# Patient Record
Sex: Female | Born: 1990 | Race: Black or African American | Hispanic: No | Marital: Single | Smoking: Former smoker
Health system: Southern US, Community
[De-identification: ages and names within clinical notes are randomized; demographics above are authoritative.]

## PROBLEM LIST (undated history)

## (undated) DIAGNOSIS — E282 Polycystic ovarian syndrome: Secondary | ICD-10-CM

## (undated) DIAGNOSIS — U071 COVID-19: Secondary | ICD-10-CM

---
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
## 2010-06-01 ENCOUNTER — Emergency Department (HOSPITAL_COMMUNITY)
Admission: EM | Admit: 2010-06-01 | Discharge: 2010-06-01 | Disposition: A | Discharge status: Home / Self Care | Payer: Self-pay | Attending: Emergency Medicine | Admitting: Emergency Medicine

## 2010-06-01 ENCOUNTER — Emergency Department (HOSPITAL_COMMUNITY): Payer: Self-pay

## 2010-06-01 DIAGNOSIS — N12 Tubulo-interstitial nephritis, not specified as acute or chronic: Secondary | ICD-10-CM | POA: Insufficient documentation

## 2010-06-01 DIAGNOSIS — R109 Unspecified abdominal pain: Secondary | ICD-10-CM | POA: Insufficient documentation

## 2010-06-01 DIAGNOSIS — R110 Nausea: Secondary | ICD-10-CM | POA: Insufficient documentation

## 2010-06-01 LAB — POCT I-STAT, CHEM 8
Calcium, Ion: 1.2 mmol/L (ref 1.12–1.32)
Glucose, Bld: 105 mg/dL — ABNORMAL HIGH (ref 70–99)
HCT: 34 % — ABNORMAL LOW (ref 36.0–46.0)
Hemoglobin: 11.6 g/dL — ABNORMAL LOW (ref 12.0–15.0)
TCO2: 24 mmol/L (ref 0–100)

## 2010-06-01 LAB — URINALYSIS, ROUTINE W REFLEX MICROSCOPIC
Glucose, UA: NEGATIVE mg/dL
Nitrite: NEGATIVE
Specific Gravity, Urine: 1.031 — ABNORMAL HIGH (ref 1.005–1.030)
pH: 5.5 (ref 5.0–8.0)

## 2010-06-01 LAB — URINE MICROSCOPIC-ADD ON

## 2010-06-01 LAB — POCT PREGNANCY, URINE: Preg Test, Ur: NEGATIVE

## 2010-07-24 NOTE — Group Therapy Note (Signed)
NAME:  Sabrina Kerr, Sabrina Kerr NO.:  [id number]   MEDICAL RECORD NO.:  [id number]          PATIENT TYPE:  WOC   LOCATION:  [facility]                   FACILITY:  [facility]   PHYSICIAN:  Johnella Moloney, MD        DATE OF BIRTH:  09/10/1990   DATE OF SERVICE:  07/08/2008                                  CLINIC NOTE   CHIEF COMPLAINT:  Amenorrhea for 5 months.   HISTORY OF PRESENT ILLNESS:  The patient is a 20 year old gravida 0 who  is here today for evaluation for amenorrhea that lasted for 5 months.  The patient had menarche at age 68.  She says that she has never had  regular periods as her periods come about every 1-2 months on average;  however, she had amenorrhea that lasted for 5 months that finally  resulted in a period in June 01, 2008.  The patient is sexually active,  but by report that she was not pregnant.  The patient is very concerned  about her amenorrhea.  She used to be on oral contraceptive pills in the  past, but is not taking any oral contraception right now.  She does use  condoms for contraception.  Apart from her irregular menses, the patient  is concerned about having a lot of acne and also having keloids on her  face and ears.  She also reports abnormal vaginal discharge and odor  that has been going on for a few months and wants evaluation for this.   PAST OBSTETRIC/GYNECOLOGICAL HISTORY:  G0.   MENSTRUAL HISTORY:  As above.  Last menstrual period was on June 01, 2008.  The patient used to be on Yaz for birth control and tolerated  this well.  She is not taking this currently.  She uses condoms for  contraception and FTI prevention.  The patient did have a last Pap smear  according to her in March 2009 which was negative.  She was told she  does not need another Pap smear until she is 60.  The patient has not  received the Gardasil series and was strongly advised to do so as soon  as possible.   PAST MEDICAL HISTORY:  Asthma which is mild.  The  patient is on no  maintenance therapy.   PAST SURGICAL HISTORY:  None.   MEDICATIONS:  None.   ALLERGIES:  No known drug allergies.   SOCIAL HISTORY:  The patient lives with her family.  She is currently in  school.  She denies smoking, drinking, or any illicit drug use.  She  denies any history of past or current sexual physical or emotional  abuse.   FAMILIAL HISTORY:  Remarkable for great aunt that had brain cancer.  There is also an extensive family history of diabetes, heart disease,  heart attack, and high blood pressure.   REVIEW OF SYSTEMS:  The 14-point comprehensive review of system was  reviewed with the patient and was only remarkable for weight gain and  vaginal odor.   PHYSICAL EXAMINATION:  VITAL SIGNS:  Temperature is 97.9, pulse 71,  blood pressure 112/74, weight 231.6  pounds, height 5 feet 6 inches.  GENERAL:  No apparent distress.  SKIN:  The patient has multiple acne papules over her face, chest, back,  and around her mons pubis.  The patient also has some acanthosis  nigricans that is visible around her neck area and some hair growth  noted on her upper lip.  ABDOMEN:  Soft, obese, nontender.  EXTREMITIES:  No cyanosis, clubbing, or edema.  EAR:  Externally, the patient does have large keloid present on  bilateral auricles.  PELVIC:  The patient does have some pimples that are noted on her mons  pubis.  Question of possible hidradenitis suppurativa, but her lesions  did not look as extensive as is usually seen in hidradenitis  suppurativa.  Otherwise, she has normal external female genitalia.  Pink  well rugated vagina.  No lesions seen.  There was copious amount of  thin, white discharge, and the sample was taken for wet prep.  There was  no odor perceived.  Her cervix is nulliparous and normal.  No abnormal  cervical discharge.  As per patient's request, a gonorrhea and Chlamydia  test was also done.  On bimanual exam, the patient has a small uterus   and no tenderness.  Adnexa were unable to be palpated secondary to  patient's habitus.   ASSESSMENT/PLAN:  The patient is a 20 year old gravida 0 who is here for  evaluation of a few concerns.  The first one being oligomenorrhea.  The  patient does meet criteria for polycystic ovarian syndrome given her  periods of anovulation and also signs of hyperandrogenism by given by  her acne and also abnormal hair growth.  This was discussed with the  patient and she was also given a ACOG patient information pamphlet to  review at home.  She was told that given her anovulatory periods and  elevated androgens that the first line of therapy is usually combined  oral contraceptive pills.  The patient is amendable to this and wants to  go back on Yaz because this was tolerated well in the past.  She was  given samples for Yaz and also a prescription for a year's worth of  Yasmin.  She was also told that this combined oral contraceptive pills  will help with her acne.  As for her vaginal odor, the patient was  counseled regarding proper vulvar hygiene and will follow up on the wet  prep results for a further evaluation and management of this.  As for  her keloids, the patient was told that she will be referred to  Dermatology for further evaluation and management.  Of note, as part of  workup for her PCOS, she had a baseline labs drawn today including TSH,  prolactin, hemoglobin A1c, free testosterone, glucose level, and HCG to  work up her amenorrhea.  She will also be booked for pelvic ultrasound  to evaluate her ovaries.  The patient was told about the risks of PCOS  and increased risk of hypertension and diabetes and weight loss efforts  where heavily recommended as this would help in resuming regular  ovulatory cycles and also reduce her risk for all these other health  conditions.  The patient was given ACOG pamphlets for her PCOS and also  for her vaginitis.  She was also strongly encouraged to  [facility] with  [facility] to initiate and obtain a Gardasil series.  She was  given written information about the Gardasil also.  The patient is to  follow up in 2-3 weeks for discussion of her results and to address any  further concerns or questions the patient might have.           ______________________________  Johnella Moloney, MD     UD/MEDQ  D:  07/08/2008  T:  07/09/2008  Job:  [id number]

## 2011-03-21 ENCOUNTER — Encounter (HOSPITAL_COMMUNITY): Payer: Self-pay | Admitting: *Deleted

## 2011-03-21 ENCOUNTER — Emergency Department (HOSPITAL_COMMUNITY)
Admission: EM | Admit: 2011-03-21 | Discharge: 2011-03-22 | Discharge status: Against Medical Advice | Payer: Medicaid Other | Attending: Emergency Medicine | Admitting: Emergency Medicine

## 2011-03-21 DIAGNOSIS — R6883 Chills (without fever): Secondary | ICD-10-CM | POA: Insufficient documentation

## 2011-03-21 NOTE — ED Notes (Signed)
Pt called x 2 with no answer  

## 2011-03-21 NOTE — ED Notes (Signed)
Name called no answer 

## 2011-03-21 NOTE — ED Notes (Signed)
Cold for 3 days with a headache and an elevated temp with body aches

## 2013-02-06 ENCOUNTER — Emergency Department (HOSPITAL_COMMUNITY): Payer: Self-pay

## 2013-02-06 ENCOUNTER — Emergency Department (HOSPITAL_COMMUNITY)
Admission: EM | Admit: 2013-02-06 | Discharge: 2013-02-06 | Disposition: A | Discharge status: Home / Self Care | Payer: Self-pay | Attending: Emergency Medicine | Admitting: Emergency Medicine

## 2013-02-06 DIAGNOSIS — N390 Urinary tract infection, site not specified: Secondary | ICD-10-CM | POA: Insufficient documentation

## 2013-02-06 DIAGNOSIS — F17200 Nicotine dependence, unspecified, uncomplicated: Secondary | ICD-10-CM | POA: Insufficient documentation

## 2013-02-06 DIAGNOSIS — Z3202 Encounter for pregnancy test, result negative: Secondary | ICD-10-CM | POA: Insufficient documentation

## 2013-02-06 DIAGNOSIS — R42 Dizziness and giddiness: Secondary | ICD-10-CM | POA: Insufficient documentation

## 2013-02-06 DIAGNOSIS — A5901 Trichomonal vulvovaginitis: Secondary | ICD-10-CM | POA: Insufficient documentation

## 2013-02-06 DIAGNOSIS — J45909 Unspecified asthma, uncomplicated: Secondary | ICD-10-CM | POA: Insufficient documentation

## 2013-02-06 DIAGNOSIS — R110 Nausea: Secondary | ICD-10-CM | POA: Insufficient documentation

## 2013-02-06 DIAGNOSIS — A599 Trichomoniasis, unspecified: Secondary | ICD-10-CM

## 2013-02-06 DIAGNOSIS — R109 Unspecified abdominal pain: Secondary | ICD-10-CM | POA: Insufficient documentation

## 2013-02-06 LAB — CBC WITH DIFFERENTIAL/PLATELET
Basophils Absolute: 0 10*3/uL (ref 0.0–0.1)
Basophils Relative: 1 % (ref 0–1)
MCHC: 33.8 g/dL (ref 30.0–36.0)
Monocytes Absolute: 0.4 10*3/uL (ref 0.1–1.0)
Neutro Abs: 2.3 10*3/uL (ref 1.7–7.7)
Neutrophils Relative %: 42 % — ABNORMAL LOW (ref 43–77)
Platelets: 364 10*3/uL (ref 150–400)
RDW: 12.2 % (ref 11.5–15.5)
WBC: 5.5 10*3/uL (ref 4.0–10.5)

## 2013-02-06 LAB — URINALYSIS, ROUTINE W REFLEX MICROSCOPIC
Glucose, UA: NEGATIVE mg/dL
Protein, ur: NEGATIVE mg/dL

## 2013-02-06 LAB — COMPREHENSIVE METABOLIC PANEL
ALT: 20 U/L (ref 0–35)
AST: 17 U/L (ref 0–37)
Albumin: 4 g/dL (ref 3.5–5.2)
Chloride: 100 mEq/L (ref 96–112)
Creatinine, Ser: 0.77 mg/dL (ref 0.50–1.10)
Potassium: 4 mEq/L (ref 3.5–5.1)
Sodium: 135 mEq/L (ref 135–145)
Total Bilirubin: 0.2 mg/dL — ABNORMAL LOW (ref 0.3–1.2)

## 2013-02-06 LAB — WET PREP, GENITAL: Yeast Wet Prep HPF POC: NONE SEEN

## 2013-02-06 LAB — URINE MICROSCOPIC-ADD ON

## 2013-02-06 LAB — POCT PREGNANCY, URINE: Preg Test, Ur: NEGATIVE

## 2013-02-06 MED ORDER — KETOROLAC TROMETHAMINE 30 MG/ML IJ SOLN
30.0000 mg | Freq: Once | INTRAMUSCULAR | Status: AC
  Administered 2013-02-06: 30 mg via INTRAVENOUS
  Filled 2013-02-06: qty 1

## 2013-02-06 MED ORDER — SODIUM CHLORIDE 0.9 % IV BOLUS (SEPSIS)
1000.0000 mL | Freq: Once | INTRAVENOUS | Status: AC
  Administered 2013-02-06: 1000 mL via INTRAVENOUS

## 2013-02-06 MED ORDER — DEXTROSE 5 % IV SOLN
1.0000 g | Freq: Once | INTRAVENOUS | Status: AC
  Administered 2013-02-06: 1 g via INTRAVENOUS
  Filled 2013-02-06: qty 10

## 2013-02-06 MED ORDER — METRONIDAZOLE 500 MG PO TABS: 500.0000 mg | ORAL_TABLET | Freq: Two times a day (BID) | ORAL | Status: DC

## 2013-02-06 MED ORDER — CEPHALEXIN 500 MG PO CAPS: 500.0000 mg | ORAL_CAPSULE | Freq: Four times a day (QID) | ORAL | Status: DC

## 2013-02-06 NOTE — ED Notes (Signed)
The pt has had vaginal bleeding for approx one week.  No  n v or diarrhea.

## 2013-02-06 NOTE — ED Notes (Signed)
Pt came in today b/c she is worried about having a period that is longer/heavier than normal.  Normally her periods last 3 days.  But her period started on 11/21 and ended on 11/28 and pt states she had large clots.  Pt c/o L lower back pain that radiates to her L lower abdomen.  Pt denies any vaginal discharge/bleeding at this time.  But she is worried she has ovarian cancer b/c she feels nauseated.  Her nephew just died of sids and she feels sick about it.

## 2013-02-06 NOTE — ED Notes (Signed)
One unsuccessful  Attempt to start iv when the c-t tech arrived .  Pt to c-t

## 2013-02-06 NOTE — ED Notes (Signed)
Pt returned from ct

## 2013-02-06 NOTE — ED Provider Notes (Signed)
CSN: [id number]     Arrival date & time 02/06/13  1504 History   First MD Initiated Contact with Patient 02/06/13 1739     Chief Complaint  Patient presents with  . heavy menses   . Nausea   (Consider location/radiation/quality/duration/timing/severity/associated sxs/prior Treatment) Patient is a 22 y.o. female presenting with abdominal pain. The history is provided by the patient.  Abdominal Pain Pain location:  L flank and suprapubic Pain quality: aching and sharp   Pain radiates to:  L flank (lower back) Pain severity:  Moderate Onset quality:  Sudden Duration:  2 days Timing:  Intermittent Progression:  Worsening Chronicity:  New Context: not recent sexual activity, not retching and not sick contacts   Context comment:  Began at end of last period Relieved by:  Nothing Worsened by:  Nothing tried Associated symptoms: nausea and vaginal bleeding (last period twice as long and much heavier than normal.)   Associated symptoms: no chest pain, no cough, no fever, no shortness of breath and no vomiting     Past Medical History  Diagnosis Date  . Asthma    No past surgical history on file. No family history on file. History  Substance Use Topics  . Smoking status: Current Every Day Smoker  . Smokeless tobacco: Not on file  . Alcohol Use: No   OB History   Grav Para Term Preterm Abortions TAB SAB Ect Mult Living                 Review of Systems  Constitutional: Negative for fever.  Respiratory: Negative for cough and shortness of breath.   Cardiovascular: Negative for chest pain and leg swelling.  Gastrointestinal: Positive for nausea and abdominal pain. Negative for vomiting.  Genitourinary: Positive for vaginal bleeding (last period twice as long and much heavier than normal.).  Neurological: Positive for dizziness (with standing up).  All other systems reviewed and are negative.    Allergies  Review of patient's allergies indicates no known allergies.  Home  Medications   Current Outpatient Rx  Name  Route  Sig  Dispense  Refill  . diphenhydrAMINE (BENADRYL) 25 mg capsule   Oral   Take 25 mg by mouth daily as needed for allergies.         Marland Kitchen ibuprofen (ADVIL,MOTRIN) 200 MG tablet   Oral   Take 400 mg by mouth every 8 (eight) hours as needed for mild pain. For pain/fever           BP 105/78  Pulse 76  Temp(Src) 98.7 F (37.1 C) (Oral)  Resp 16  Ht 5\' 6"  (1.676 m)  Wt 246 lb (111.585 kg)  BMI 39.72 kg/m2  SpO2 100%  LMP 01/29/2013 Physical Exam  Nursing note and vitals reviewed. Constitutional: She is oriented to person, place, and time. She appears well-developed and well-nourished. No distress.  HENT:  Head: Normocephalic and atraumatic.  Eyes: EOM are normal. Pupils are equal, round, and reactive to light.  Neck: Normal range of motion. Neck supple.  Cardiovascular: Normal rate and regular rhythm.  Exam reveals no friction rub.   No murmur heard. Pulmonary/Chest: Effort normal and breath sounds normal. No respiratory distress. She has no wheezes. She has no rales.  Abdominal: Soft. She exhibits no distension. There is tenderness (L flank, suprapubic. No CVA tenderness). There is no rebound.  Genitourinary: There is no rash, tenderness or lesion on the right labia. There is no rash, tenderness or lesion on the left labia. Cervix exhibits  discharge (brown/red). Cervix exhibits no motion tenderness. Right adnexum displays no mass, no tenderness and no fullness. Left adnexum displays no mass, no tenderness and no fullness.  Musculoskeletal: Normal range of motion. She exhibits no edema.  Neurological: She is alert and oriented to person, place, and time. No cranial nerve deficit. She exhibits normal muscle tone. Coordination normal.  Skin: Skin is warm. No rash noted. She is not diaphoretic.    ED Course  Procedures (including critical care time) Labs Review Labs Reviewed  CBC WITH DIFFERENTIAL - Abnormal; Notable for the  following:    Neutrophils Relative % 42 (*)    Lymphocytes Relative 49 (*)    All other components within normal limits  COMPREHENSIVE METABOLIC PANEL - Abnormal; Notable for the following:    Total Bilirubin 0.2 (*)    All other components within normal limits  URINALYSIS, ROUTINE W REFLEX MICROSCOPIC - Abnormal; Notable for the following:    APPearance CLOUDY (*)    Hgb urine dipstick LARGE (*)    Leukocytes, UA MODERATE (*)    All other components within normal limits  URINE MICROSCOPIC-ADD ON - Abnormal; Notable for the following:    Squamous Epithelial / LPF FEW (*)    Bacteria, UA FEW (*)    All other components within normal limits  URINE CULTURE  GC/CHLAMYDIA PROBE AMP  WET PREP, GENITAL  POCT PREGNANCY, URINE   Imaging Review No results found.  EKG Interpretation   None       MDM   1. UTI (urinary tract infection)   2. Trichomonas    89F presents with L flank pain. Intermittent, sharp pains, started at the end of her period. Continued after period ended. No pains like this before. No vaginal discharge or bleeding at this time. Associated nausea and orthostatic dizziness. Last period was heavier and longer than normal with clots, which is new for her. Here AFVSS. Mild L flank pain on exam, no CVA tenderness. Suprapubic tenderness also.  Not pregnant, blood counts normal. With L flank pain and large blood on UA, will scan for kidney stone. Rocephin given for UTI. No kidney stone here. Trichomonas seen on wet prep. Keflex and flagyl given. Stable for discharge.     Dagmar Hait, MD 02/07/13 0005

## 2013-02-07 LAB — URINE CULTURE

## 2013-02-09 LAB — GC/CHLAMYDIA PROBE AMP: CT Probe RNA: NEGATIVE

## 2014-12-28 IMAGING — CT CT ABD-PELV W/O CM
2 of 4 series · 17 of 46 positions shown, 19 images · non-contrast
Comparison: 06/01/2010 CT

CLINICAL DATA: 22-year-old female with left abdominal and pelvic
pain and menorrhagia.

EXAM:
CT ABDOMEN AND PELVIS WITHOUT CONTRAST
TECHNIQUE: Multidetector CT imaging of the abdomen and pelvis was performed
following the standard protocol without intravenous contrast.

[Series 2: abd/ pelvis 5.0 i30f 1 · axial · 0.70mm/px · z∈[+194,+659]mm · 14 of 105 slices shown, 16 images]
[im 6/105  soft-tissue]
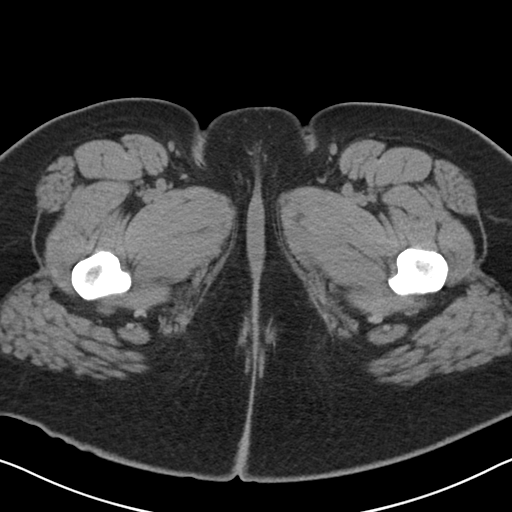
[im 6/105  bone]
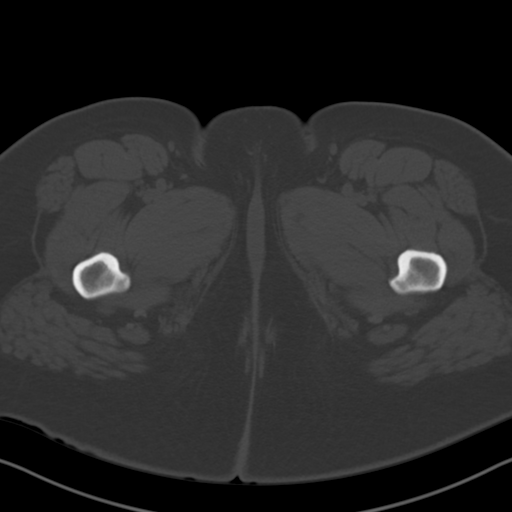
[im 11/105  soft-tissue]
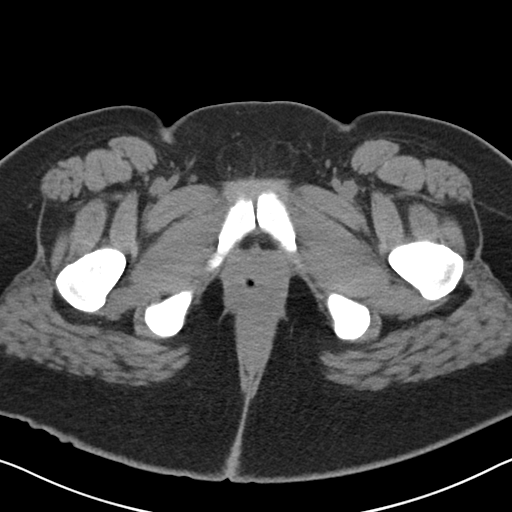
[im 22/105  soft-tissue]
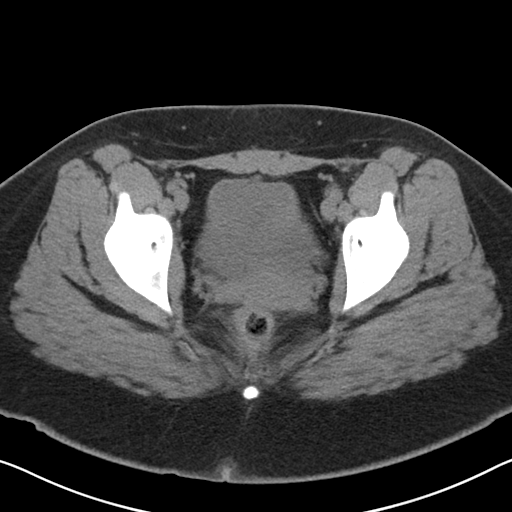
[im 28/105  soft-tissue]
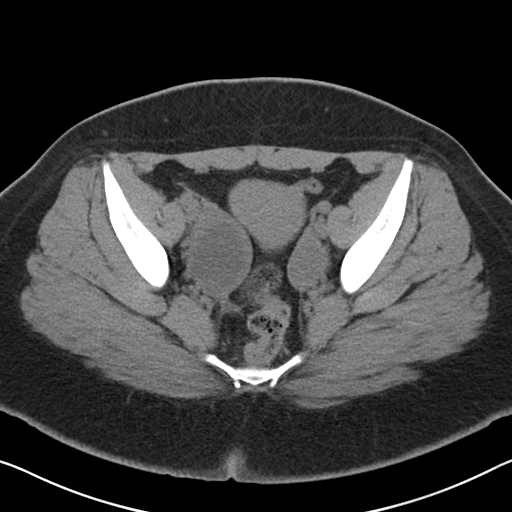
[im 33/105  soft-tissue]
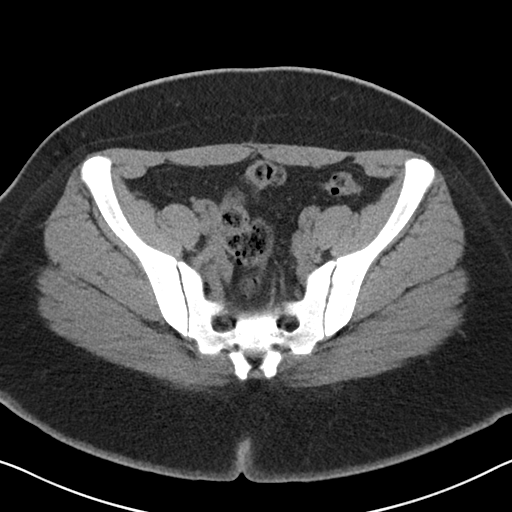
[im 44/105  soft-tissue]
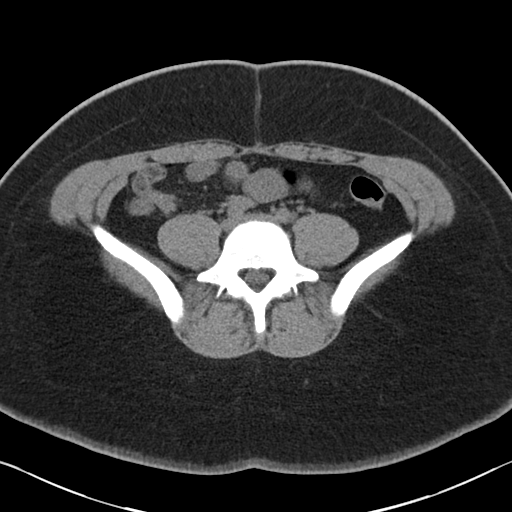
[im 50/105  soft-tissue]
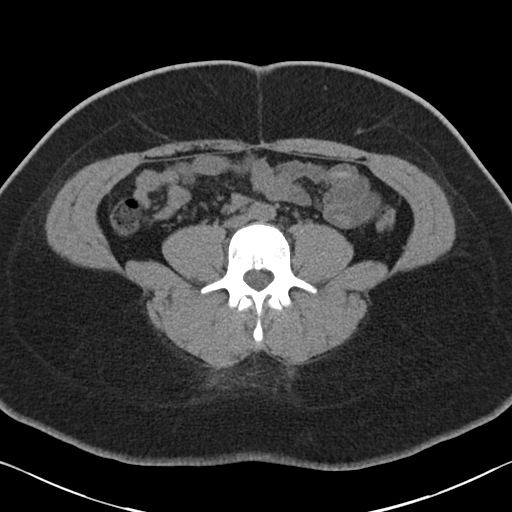
[im 55/105  soft-tissue]
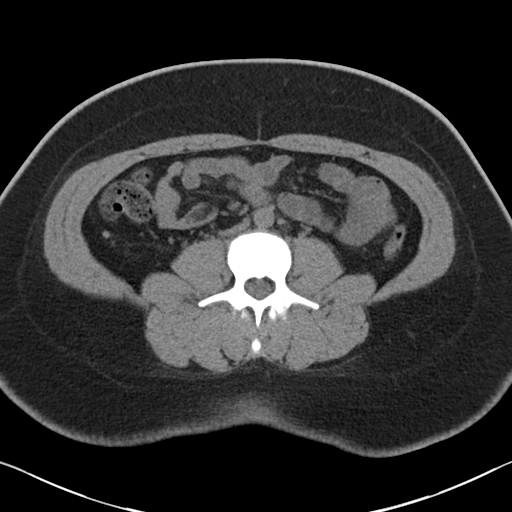
[im 61/105  soft-tissue]
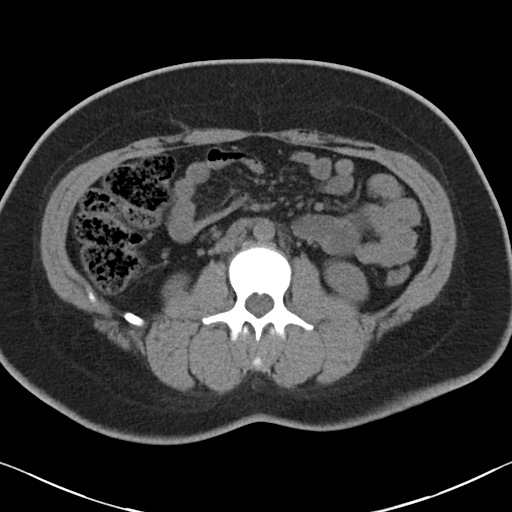
[im 61/105  bone]
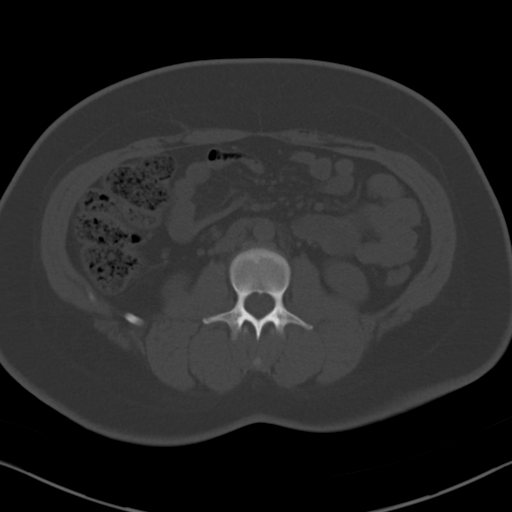
[im 72/105  soft-tissue]
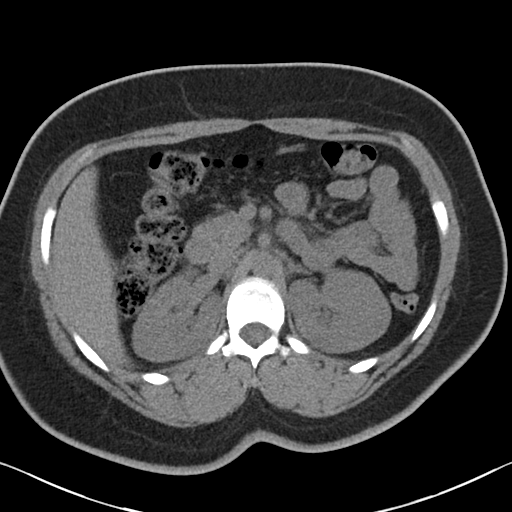
[im 77/105  soft-tissue]
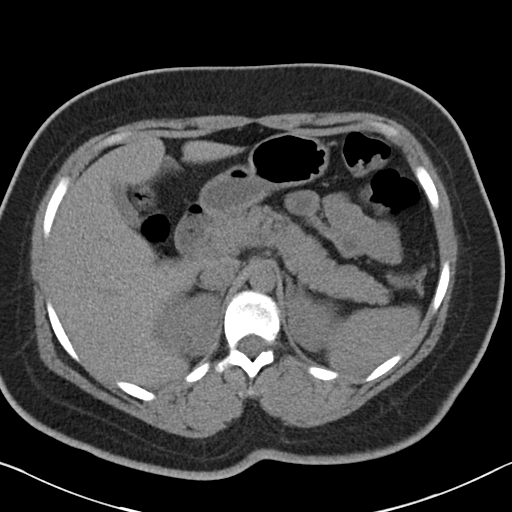
[im 83/105  soft-tissue]
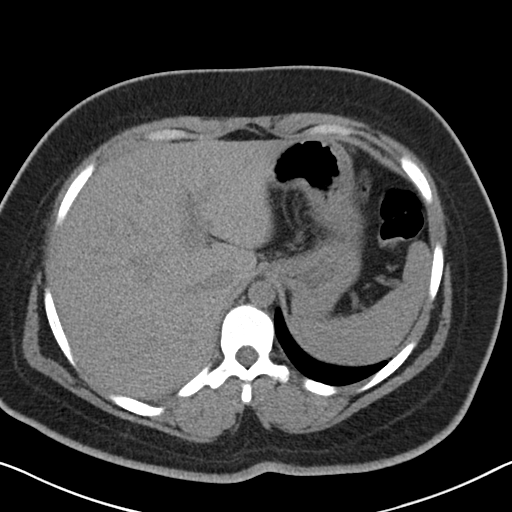
[im 94/105  soft-tissue]
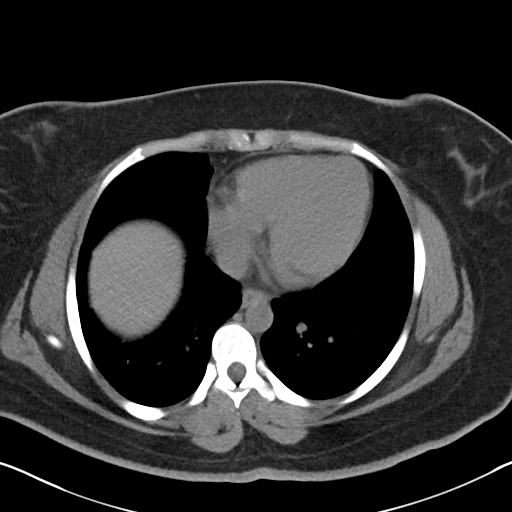
[im 99/105  soft-tissue]
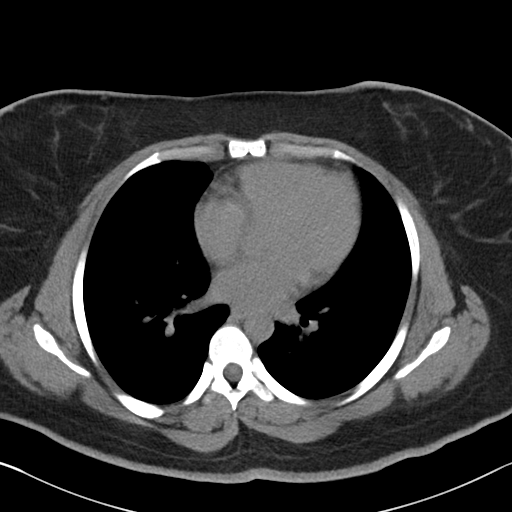

[Series 5: cor st · coronal · 0.90mm/px · 3 of 101 slices shown]
[im 34/101  soft-tissue]
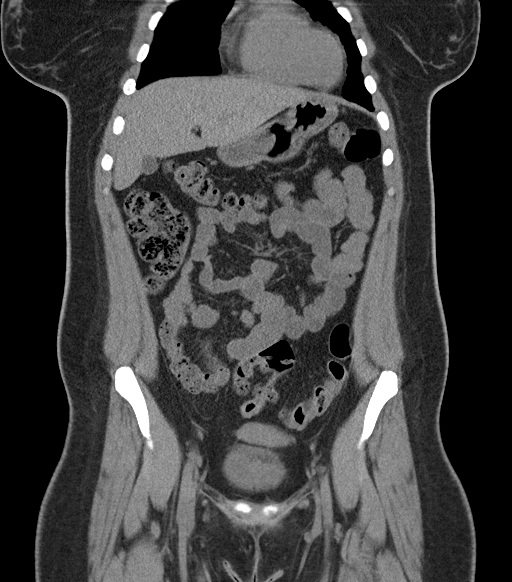
[im 45/101  soft-tissue]
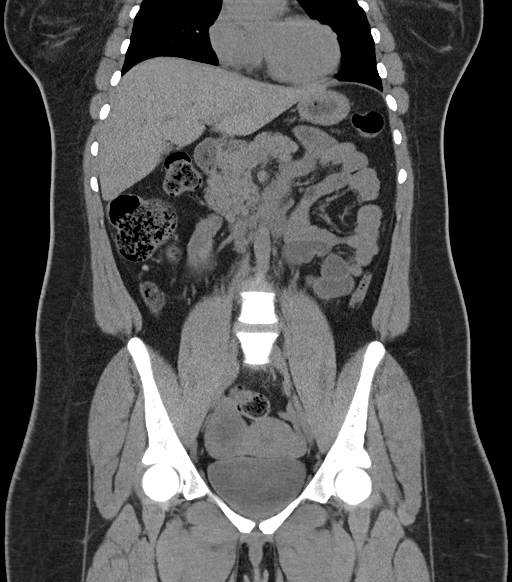
[im 56/101  soft-tissue]
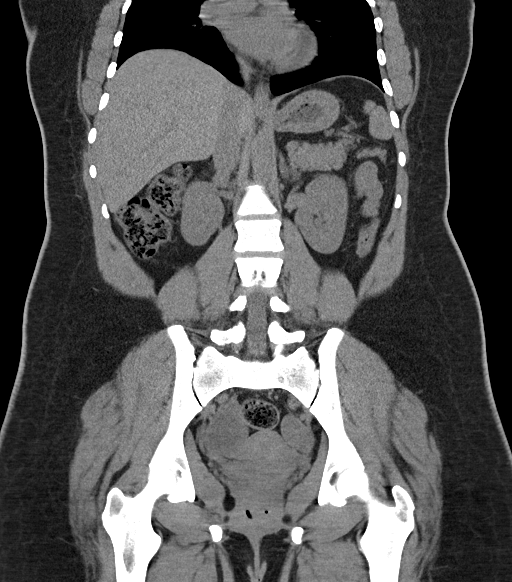

[17 of 46 positions shown; findings below may reference images not displayed]

FINDINGS: The lung bases are clear.

The liver, spleen, pancreas, adrenal glands, kidneys and gallbladder
are unremarkable.

Please note that parenchymal abnormalities may be missed without
intravenous contrast.

There is no evidence of free fluid, enlarged lymph nodes, biliary
dilation or abdominal aortic aneurysm.

The bowel, bladder and appendix are unremarkable. There is no
evidence of bowel obstruction, abscess or pneumoperitoneum.

Bilateral ovarian masses/cysts are relatively unchanged since 6436.
The right ovarian lesion measures 4.1 x 5.8 cm and the left ovarian
lesion measures 2.9 x 3.7 cm (image 79).

No acute or suspicious bony abnormalities are identified.
IMPRESSION: Relatively unchanged bilateral ovarian lesions since 6436. Suggest
characterization with pelvic ultrasound.

No other significant abnormalities identified.

## 2016-06-07 ENCOUNTER — Emergency Department (HOSPITAL_COMMUNITY)
Admission: EM | Admit: 2016-06-07 | Discharge: 2016-06-07 | Disposition: A | Discharge status: Home / Self Care | Payer: Medicaid Other | Attending: Emergency Medicine | Admitting: Emergency Medicine

## 2016-06-07 ENCOUNTER — Encounter (HOSPITAL_COMMUNITY): Payer: Self-pay | Admitting: Emergency Medicine

## 2016-06-07 DIAGNOSIS — N6310 Unspecified lump in the right breast, unspecified quadrant: Secondary | ICD-10-CM

## 2016-06-07 DIAGNOSIS — N63 Unspecified lump in unspecified breast: Secondary | ICD-10-CM

## 2016-06-07 DIAGNOSIS — Z87891 Personal history of nicotine dependence: Secondary | ICD-10-CM | POA: Insufficient documentation

## 2016-06-07 DIAGNOSIS — J45909 Unspecified asthma, uncomplicated: Secondary | ICD-10-CM | POA: Insufficient documentation

## 2016-06-07 NOTE — ED Notes (Signed)
Pt ambulated while on the pulse ox.  Pt's SpO2 remained between 98% and 100% the entire time walking down the hallway and back.  Pt denies any lightheadedness, dizziness, or weakness.

## 2016-06-07 NOTE — ED Triage Notes (Addendum)
Pt reports finding a mass about the size of a dime under her right breast two days ago. Pt reports it has gotten bigger since when she first found it. Pt reports it is firm and immobile. Pt reports this morning, when mass was pressed it caused pain. Pt reports some sob when moving right side. Pt reports grandma has hx of breast cancer.

## 2016-06-07 NOTE — Discharge Instructions (Signed)
Your breast pain is likely due to an epidermal inclusion cyst on the skin.  However, you may call and follow up at [facility] for breast ultrasound for further assessment. Apply warm compress and take ibuprofen as needed for pain.  Return if you have any concerns.

## 2016-06-07 NOTE — ED Provider Notes (Signed)
[facility] Provider Note   CSN: [id number] Arrival date & time: 06/07/16  1478     History   Chief Complaint Chief Complaint  Patient presents with  . Breast Mass    HPI Sabrina Kerr is a 26 y.o. female.  HPI   26 year old female present c/o breast mass. Pt does regular self breast exam and 2 days ago she felt a non painful lump in her R breast.  Sts it's a dime size and firm.  She now notice mild achy pain at the breast mass, but unsure if it was due to her manipulating it prior.  She also report SOB with ambulation since noticing the lump, but no SOB with rest.  No associate fever, cp, productive cough, hemoptysis or rash.  LMP oct 2017, and report irregular menstruation.  Denies being sexually active.  Denies prior hx of PE/DVT, recent surgery, prolonged bedrest, unilateral calf swelling or leg pain, Report grandma has hx of breast CA, and she is concern.  She did report gaining 40lbs of weight x 1 year, but attributed to diet changes.  Denies myalgias or night sweat.  No nipple discharge or rash, no recent injury.  No specific treatment tried.    Past Medical History:  Diagnosis Date  . Asthma     There are no active problems to display for this patient.   History reviewed. No pertinent surgical history.  OB History    No data available       Home Medications    Prior to Admission medications   Medication Sig Start Date End Date Taking? Authorizing Provider  diphenhydrAMINE (BENADRYL) 25 mg capsule Take 25 mg by mouth daily as needed for allergies.   Yes Historical Provider, MD  ibuprofen (ADVIL,MOTRIN) 200 MG tablet Take 400 mg by mouth every 8 (eight) hours as needed for mild pain. For pain/fever    Yes Historical Provider, MD    Family History No family history on file.  Social History Social History  Substance Use Topics  . Smoking status: Former Smoker    Types: Cigarettes  . Smokeless tobacco: Never Used     Comment: Quit Feb 2016  .  Alcohol use No     Allergies   Patient has no known allergies.   Review of Systems Review of Systems  Constitutional: Negative for fever.  Respiratory: Positive for shortness of breath.   All other systems reviewed and are negative.    Physical Exam Updated Vital Signs BP 104/67   Pulse (!) 59   Temp 98.8 F (37.1 C) (Oral)   Resp 19   Ht  (1.676 m)   Wt 117.9 kg   LMP 01/06/2016 Comment: pt reports irregular periods  SpO2 98%   BMI 41.97 kg/m   Physical Exam  Constitutional: She appears well-developed and well-nourished. No distress.  HENT:  Head: Atraumatic.  Eyes: Conjunctivae are normal.  Neck: Neck supple.  Cardiovascular: Normal rate and regular rhythm.   Pulmonary/Chest: Effort normal and breath sounds normal.    Chaperone present during exam.  Aside from a superficial tender epidermal cyst noted to medial aspect of R breast .  No peau d'orange, no nipple inversion, no mastitis.   Abdominal: Soft. She exhibits no distension. There is no tenderness.  Neurological: She is alert.  Skin: No rash noted.  Psychiatric: She has a normal mood and affect.  Nursing note and vitals reviewed.    ED Treatments / Results  Labs (all labs ordered  are listed, but only abnormal results are displayed) Labs Reviewed - No data to display  EKG  EKG Interpretation None       Radiology No results found.  Procedures Procedures (including critical care time)  Medications Ordered in ED Medications - No data to display   Initial Impression / Assessment and Plan / ED Course  I have reviewed the triage vital signs and the nursing notes.  Pertinent labs & imaging results that were available during my care of the patient were reviewed by me and considered in my medical decision making (see chart for details).     BP 104/67   Pulse (!) 59   Temp 98.8 F (37.1 C) (Oral)   Resp 19   Ht  (1.676 m)   Wt 117.9 kg   LMP 01/06/2016 Comment: pt reports  irregular periods  SpO2 98%   BMI 41.97 kg/m    Final Clinical Impressions(s) / ED Diagnoses   Final diagnoses:  Breast lump in female    New Prescriptions New Prescriptions   No medications on file   8:30 AM Pt here requesting evaluation for a lump in her R breast.  She also report having mild exertional SOB since discovering the lump.  She is PERC negative, doubt PE.  Lump is a superficial epidermal cyst that does not appear infected but is mildly tender. Pt ambulate while maintaining O2 >98 on RA.  Reassurance given.  However, pt can f/u outpt for R breast US at the breast center for further evaluation of her breast tenderness.   Recommend warm compress and ibuprofen as needed for pain.  Return precaution given. Pt is not pregnant.     Fayrene Helper, PA-C 06/07/16 1478    Mancel Bale, MD 06/07/16 [phone]

## 2016-12-16 ENCOUNTER — Encounter (HOSPITAL_COMMUNITY): Payer: Self-pay

## 2016-12-16 ENCOUNTER — Emergency Department (HOSPITAL_COMMUNITY): Payer: BLUE CROSS/BLUE SHIELD

## 2016-12-16 ENCOUNTER — Emergency Department (HOSPITAL_COMMUNITY)
Admission: EM | Admit: 2016-12-16 | Discharge: 2016-12-16 | Disposition: A | Discharge status: Home / Self Care | Payer: BLUE CROSS/BLUE SHIELD | Attending: Emergency Medicine | Admitting: Emergency Medicine

## 2016-12-16 DIAGNOSIS — J45909 Unspecified asthma, uncomplicated: Secondary | ICD-10-CM | POA: Diagnosis not present

## 2016-12-16 DIAGNOSIS — R221 Localized swelling, mass and lump, neck: Secondary | ICD-10-CM | POA: Diagnosis present

## 2016-12-16 DIAGNOSIS — R591 Generalized enlarged lymph nodes: Secondary | ICD-10-CM | POA: Diagnosis not present

## 2016-12-16 DIAGNOSIS — Z87891 Personal history of nicotine dependence: Secondary | ICD-10-CM | POA: Diagnosis not present

## 2016-12-16 LAB — BASIC METABOLIC PANEL
Anion gap: 6 (ref 5–15)
BUN: 6 mg/dL (ref 6–20)
CO2: 25 mmol/L (ref 22–32)
Calcium: 8.6 mg/dL — ABNORMAL LOW (ref 8.9–10.3)
Chloride: 105 mmol/L (ref 101–111)
Creatinine, Ser: 0.69 mg/dL (ref 0.44–1.00)
GFR calc Af Amer: >60 mL/min (ref >60)
GFR calc non Af Amer: >60 mL/min (ref >60)
Glucose, Bld: 100 mg/dL — ABNORMAL HIGH (ref 65–99)
Potassium: 3.8 mmol/L (ref 3.5–5.1)
Sodium: 136 mmol/L (ref 135–145)

## 2016-12-16 LAB — CBC WITH DIFFERENTIAL/PLATELET
Basophils Absolute: 0 10*3/uL (ref 0.0–0.1)
Basophils Relative: 0 %
Eosinophils Absolute: 0.1 10*3/uL (ref 0.0–0.7)
Eosinophils Relative: 2 %
HCT: 35.3 % — ABNORMAL LOW (ref 36.0–46.0)
Hemoglobin: 11.1 g/dL — ABNORMAL LOW (ref 12.0–15.0)
Lymphocytes Relative: 47 %
Lymphs Abs: 2.9 10*3/uL (ref 0.7–4.0)
MCH: 28.7 pg (ref 26.0–34.0)
MCHC: 31.4 g/dL (ref 30.0–36.0)
MCV: 91.2 fL (ref 78.0–100.0)
Monocytes Absolute: 0.5 10*3/uL (ref 0.1–1.0)
Monocytes Relative: 9 %
Neutro Abs: 2.6 10*3/uL (ref 1.7–7.7)
Neutrophils Relative %: 42 %
Platelets: 303 10*3/uL (ref 150–400)
RBC: 3.87 MIL/uL (ref 3.87–5.11)
RDW: 12.7 % (ref 11.5–15.5)
WBC: 6.2 10*3/uL (ref 4.0–10.5)

## 2016-12-16 MED ORDER — IOPAMIDOL (ISOVUE-300) INJECTION 61%
INTRAVENOUS | Status: AC
  Filled 2016-12-16: qty 75

## 2016-12-16 MED ORDER — DOXYCYCLINE HYCLATE 100 MG PO CAPS: 100.0000 mg | ORAL_CAPSULE | Freq: Two times a day (BID) | ORAL | 0 refills | Status: AC

## 2016-12-16 NOTE — ED Notes (Signed)
Pt verbalized understanding of d/c instructions and has no further questions. VSS, NAD. Pt removed all belongings.  

## 2016-12-16 NOTE — ED Notes (Signed)
Patient transported to CT 

## 2016-12-16 NOTE — ED Triage Notes (Signed)
Pt here for a knot underneath her neck. She also reports sore throat. Resp e/u. Pt alert and orientedX4.

## 2016-12-16 NOTE — ED Provider Notes (Signed)
[facility]-EMERGENCY DEPT Provider Note   CSN: [id number] Arrival date & time: 12/16/16  4098     History   Chief Complaint Chief Complaint  Patient presents with  . Cyst    HPI Sabrina Kerr is a 26 y.o. female.  HPI Patient presents to the emergency department with an area in her neck that is swollen.  The patient states that the area started 2 days ago.  She states she does not have any other symptoms such as sore throat, nausea, vomiting, weakness, dizziness, headache, blurred vision, back pain, difficulty swallowing, difficulty breathing, chest pain or syncope.  Patient states she taking any medications prior to arrival Past Medical History:  Diagnosis Date  . Asthma     There are no active problems to display for this patient.   History reviewed. No pertinent surgical history.  OB History    No data available       Home Medications    Prior to Admission medications   Medication Sig Start Date End Date Taking? Authorizing Provider  acetaminophen (TYLENOL) 500 MG tablet Take 1,000 mg by mouth every 4 (four) hours as needed for mild pain or headache.   Yes [provider]  Aspirin-Acetaminophen-Caffeine (GOODY HEADACHE PO) Take 1 packet by mouth every 6 (six) hours as needed (pain).   Yes [provider]    Family History History reviewed. No pertinent family history.  Social History Social History  Substance Use Topics  . Smoking status: Former Smoker    Types: Cigarettes  . Smokeless tobacco: Never Used     Comment: Quit Feb 2016  . Alcohol use No     Allergies   Patient has no known allergies.   Review of Systems Review of Systems All other systems negative except as documented in the HPI. All pertinent positives and negatives as reviewed in the HPI.  Physical Exam Updated Vital Signs BP 123/86   Pulse 63   Temp 97.9 F (36.6 C) (Oral)   Resp 16   Ht  (1.676 m)   Wt 108.9 kg (240 lb)   LMP 11/16/2016 (Within Days)    SpO2 99%   BMI 38.74 kg/m   Physical Exam  Constitutional: She is oriented to person, place, and time. She appears well-developed and well-nourished. No distress.  HENT:  Head: Normocephalic and atraumatic.  Mouth/Throat: Oropharynx is clear and moist.  Eyes: Pupils are equal, round, and reactive to light.  Neck: Normal range of motion. Neck supple.    Cardiovascular: Normal rate, regular rhythm and normal heart sounds.  Exam reveals no gallop and no friction rub.   No murmur heard. Pulmonary/Chest: Effort normal and breath sounds normal. No respiratory distress. She has no wheezes.  Abdominal: Soft. Bowel sounds are normal. She exhibits no distension. There is no tenderness.  Neurological: She is alert and oriented to person, place, and time. She exhibits normal muscle tone. Coordination normal.  Skin: Skin is warm and dry. Capillary refill takes less than 2 seconds. No rash noted. No erythema.  Psychiatric: She has a normal mood and affect. Her behavior is normal.  Nursing note and vitals reviewed.    ED Treatments / Results  Labs (all labs ordered are listed, but only abnormal results are displayed) Labs Reviewed  BASIC METABOLIC PANEL - Abnormal; Notable for the following:       Result Value   Glucose, Bld 100 (*)    Calcium 8.6 (*)    All other components within normal limits  CBC WITH DIFFERENTIAL/PLATELET - Abnormal; Notable for the following:    Hemoglobin 11.1 (*)    HCT 35.3 (*)    All other components within normal limits    EKG  EKG Interpretation None       Radiology Ct Soft Tissue Neck W Contrast  Result Date: 12/16/2016 CLINICAL DATA:  26 year old female with soft tissue tenderness and swelling just below the mandible on the right for 2 days. Afebrile. EXAM: CT NECK WITH CONTRAST TECHNIQUE: Multidetector CT imaging of the neck was performed using the standard protocol following the bolus administration of intravenous contrast. CONTRAST:  75 mL  Isovue-300 COMPARISON:  None. FINDINGS: Pharynx and larynx: Laryngeal and pharyngeal soft tissue contours are within normal limits. Negative parapharyngeal and retropharyngeal spaces. Salivary glands: Negative sublingual space. The submandibular glands appear symmetric and within normal limits. No sialolithiasis identified. In the submental soft tissues/midline level 1 nodal station there is thickening of the platysma and subcutaneous stranding around an indistinct 13 x 16 mm soft tissue structure which appears to be a enlarged level 1 a lymph node (series 3, image 80, sagittal image 50, and coronal image 40. This is near the marked area of clinical concern. There is small left paracentral nodularity here which might be a normal adjacent level 1 a node. The bilateral level 1B nodes remain normal. No fluid collection. Thyroid: Negative. Lymph nodes: See the level 1 lymph node findings above. Other bilateral lymph node stations are within normal limits. No cystic or necrotic nodes. Vascular: Major vascular structures in the neck and at the skullbase are patent. Limited intracranial: Negative. Visualized orbits: Negative. Mastoids and visualized paranasal sinuses: Clear aside from polypoid mucosal thickening in the maxillary sinuses greater on the right. Suspect mucous retention cysts. Skeleton: Large dental caries right maxillary posterior molar (sagittal image 35). The mandible dentition appears normal. No acute osseous abnormality identified. Upper chest: Negative visualized superior mediastinum. Negative lung apices. IMPRESSION: 1. Midline submental soft tissue inflammation with what appears to be an enlarged right level 1a lymph node near the marked area of palpable concern. See sagittal image 50. No fluid collection. Recommend empiric treatment for cellulitis/lymphadenitis, with repeat neck CT with IV contrast if the patient does not improve as expected. 2. No mandible odontogenic source of infection. Prominent  dental caries right maxillary posterior molar. 3. Otherwise negative neck soft tissues. 4. Maxillary sinus mucosal thickening greater on the right appears mostly related to mucous retention cyst(s). Electronically Signed   By: Odessa Fleming M.D.   On: 12/16/2016 10:46    Procedures Procedures (including critical care time)  Medications Ordered in ED Medications  iopamidol (ISOVUE-300) 61 % injection (not administered)     Initial Impression / Assessment and Plan / ED Course  I have reviewed the triage vital signs and the nursing notes.  Pertinent labs & imaging results that were available during my care of the patient were reviewed by me and considered in my medical decision making (see chart for details).     Patient be treated for possible folliculitis in this area.  Told to return here as needed.  Patient agrees the plan and all questions were answered.  Did advise her use some warm compresses over the area  Final Clinical Impressions(s) / ED Diagnoses   Final diagnoses:  None    New Prescriptions New Prescriptions   No medications on file     Charlestine Night, Cordelia Poche 12/16/16 1108    Gwyneth Sprout, MD 12/16/16 2045

## 2016-12-16 NOTE — Discharge Instructions (Signed)
Return here as needed.  Follow-up with a primary doctor, urgent care.  CT scan does not show any signs of deep space infection.  It does show an enlarged lymph node in the region

## 2018-03-13 ENCOUNTER — Other Ambulatory Visit: Payer: Self-pay

## 2018-03-13 ENCOUNTER — Encounter (HOSPITAL_COMMUNITY): Payer: Self-pay | Admitting: Emergency Medicine

## 2018-03-13 ENCOUNTER — Emergency Department (HOSPITAL_COMMUNITY)
Admission: EM | Admit: 2018-03-13 | Discharge: 2018-03-13 | Disposition: A | Discharge status: Home / Self Care | Payer: BLUE CROSS/BLUE SHIELD | Attending: Emergency Medicine | Admitting: Emergency Medicine

## 2018-03-13 DIAGNOSIS — J111 Influenza due to unidentified influenza virus with other respiratory manifestations: Secondary | ICD-10-CM

## 2018-03-13 DIAGNOSIS — Z79899 Other long term (current) drug therapy: Secondary | ICD-10-CM | POA: Insufficient documentation

## 2018-03-13 DIAGNOSIS — J101 Influenza due to other identified influenza virus with other respiratory manifestations: Secondary | ICD-10-CM | POA: Insufficient documentation

## 2018-03-13 DIAGNOSIS — Z87891 Personal history of nicotine dependence: Secondary | ICD-10-CM | POA: Insufficient documentation

## 2018-03-13 DIAGNOSIS — R69 Illness, unspecified: Secondary | ICD-10-CM

## 2018-03-13 MED ORDER — ONDANSETRON 4 MG PO TBDP: 4.0000 mg | ORAL_TABLET | Freq: Three times a day (TID) | ORAL | 0 refills | Status: AC | PRN

## 2018-03-13 NOTE — ED Provider Notes (Signed)
[facility] EMERGENCY DEPARTMENT Provider Note   CSN: [id number] Arrival date & time: 03/13/18  0021     History   Chief Complaint Chief Complaint  Patient presents with  . Flu-Like Symptoms    HPI Sabrina Kerr is a 28 y.o. female is here for evaluation of flu like symptoms onset yesterday morning, sudden. Reports nausea, vomiting, diarrhea, nasal congestion, rhinorrhea, dry cough, headaches, body aches, low grade fevers.  Took alkaseltzer without significant relief. No known sick contacts. Denies any Cp, SOB, abdominal pain, urinary symptoms. No modifying factors.   HPI  Past Medical History:  Diagnosis Date  . Asthma     There are no active problems to display for this patient.   History reviewed. No pertinent surgical history.   OB History   No obstetric history on file.      Home Medications    Prior to Admission medications   Medication Sig Start Date End Date Taking? Authorizing Provider  acetaminophen (TYLENOL) 500 MG tablet Take 1,000 mg by mouth every 4 (four) hours as needed for mild pain or headache.    [provider]  Aspirin-Acetaminophen-Caffeine (GOODY HEADACHE PO) Take 1 packet by mouth every 6 (six) hours as needed (pain).    [provider]  doxycycline (VIBRAMYCIN) 100 MG capsule Take 1 capsule (100 mg total) by mouth 2 (two) times daily. 12/16/16   Lawyer, Cristal Deer, PA-C  ondansetron (ZOFRAN ODT) 4 MG disintegrating tablet Take 1 tablet (4 mg total) by mouth every 8 (eight) hours as needed for nausea or vomiting. 03/13/18   Liberty Handy, PA-C    Family History No family history on file.  Social History Social History   Tobacco Use  . Smoking status: Former Smoker    Types: Cigarettes  . Smokeless tobacco: Never Used  . Tobacco comment: Quit Feb 2016  Substance Use Topics  . Alcohol use: No  . Drug use: No     Allergies   Patient has no known allergies.   Review of Systems Review of  Systems  Constitutional: Positive for fever.  HENT: Positive for congestion, postnasal drip and rhinorrhea.   Respiratory: Positive for cough.   Gastrointestinal: Positive for diarrhea, nausea and vomiting.  Musculoskeletal: Positive for myalgias.  Neurological: Positive for headaches.  All other systems reviewed and are negative.    Physical Exam Updated Vital Signs BP 106/87 (BP Location: Right Arm)   Pulse 86   Temp 99 F (37.2 C) (Oral)   Resp 17   Ht 5\' 6"  (1.676 m)   Wt 116.1 kg   LMP 03/25/2017 (Approximate)   SpO2 100%   BMI 41.32 kg/m   Physical Exam Vitals signs and nursing note reviewed.  Constitutional:      Appearance: She is well-developed.     Comments: Non toxic  HENT:     Head: Normocephalic and atraumatic.     Nose: Congestion and rhinorrhea present.     Comments: Moderate mucosal edema bilaterally. Clear rhinorrhea noted. TM normal bilaterally.  Eyes:     Conjunctiva/sclera: Conjunctivae normal.     Pupils: Pupils are equal, round, and reactive to light.  Neck:     Musculoskeletal: Normal range of motion.     Comments: No cervical LAD  Cardiovascular:     Rate and Rhythm: Normal rate and regular rhythm.  Pulmonary:     Effort: Pulmonary effort is normal.     Breath sounds: Normal breath sounds.  Abdominal:  General: Bowel sounds are normal.     Palpations: Abdomen is soft.     Tenderness: There is no abdominal tenderness.     Comments: No G/R/R. No suprapubic or CVA tenderness. Negative Murphy's and McBurney's. Active BS to lower quadrants.   Musculoskeletal: Normal range of motion.  Skin:    General: Skin is warm and dry.     Capillary Refill: Capillary refill takes less than 2 seconds.  Neurological:     Mental Status: She is alert and oriented to person, place, and time.  Psychiatric:        Behavior: Behavior normal.      ED Treatments / Results  Labs (all labs ordered are listed, but only abnormal results are displayed) Labs  Reviewed - No data to display  EKG None  Radiology No results found.  Procedures Procedures (including critical care time)  Medications Ordered in ED Medications - No data to display   Initial Impression / Assessment and Plan / ED Course  I have reviewed the triage vital signs and the nursing notes.  Pertinent labs & imaging results that were available during my care of the patient were reviewed by me and considered in my medical decision making (see chart for details).     28 y.o. -year-old female presents with ILI x 2 days. On my exam patient is nontoxic appearing, speaking in full sentences, w/o increased WOB. No fever, tachypnea, tachycardia, hypoxia. Lungs are CTAB. I do not think that a CXR is indicated at this time as VS are WNL, there are no signs of consolidation on auscultation and there is no hypoxia. No significant h/o immunocompromise. Doubt bacterial bronchitis or pneumonia.  She has h/o childhood asthma but no needed tx or exacerbations for more than 10 years.  Given reassuring physical exam, will discharge with symptomatic treatment. Strict ED return precautions given. Patient is aware that a viral URI vs ILI may precede pneumonia or worsening illness. Patient is aware of red flag symptoms to monitor for that would warrant return to the ED for further reevaluation.   Final Clinical Impressions(s) / ED Diagnoses   Final diagnoses:  Influenza-like illness    ED Discharge Orders         Ordered    ondansetron (ZOFRAN ODT) 4 MG disintegrating tablet  Every 8 hours PRN     03/13/18 0747           Liberty Handy, PA-C 03/13/18 0831    Doug Sou, MD 03/13/18 [phone]

## 2018-03-13 NOTE — Discharge Instructions (Addendum)
Your symptoms are most likely from a virus that has caused an upper respiratory infection. Possible influenza. A viral illness typically peaks on day 2-3 and resolves after one week.     The main treatment approach for a viral upper respiratory infection is to treat the symptoms, support your immune system and prevent spread of illness.    Stay well-hydrated. Rest. You can use over the counter medications to help with symptoms: 600 mg ibuprofen (motrin, aleve, advil) or acetaminophen (tylenol) every 6 hours, around the clock to help with associated fevers, sore throat, headaches, generalized body aches and malaise.  Oxymetazoline (afrin) intranasal spray once daily for no more than 3 days to help with congestion, after 3 days you can switch to another over-the-counter nasal steroid spray such as fluticasone (flonase) Allergy medication (loratadine, cetirizine, etc) and phenylephrine (sudafed) help with nasal congestion, runny nose and postnasal drip.   Dextromethorphan (Delsym) to suppress dry cough  Guaifenesin (mucinex) to help with built up mucus in chest and productive cough Wash your hands often to prevent spread.  Ondansatron (Zofran) for nausea    A viral upper respiratory infection can also worsen and progress into pneumonia.  Monitor your symptoms. Return for persistent fevers, chest pain, productive cough, inability to tolerate fluids despite nausea medicines or dehydration

## 2018-03-13 NOTE — ED Notes (Signed)
No call x3 for recheck vitals

## 2018-03-13 NOTE — ED Triage Notes (Signed)
Pt reports flu-like symptoms that started today. Pt reports she did have a fever but it has since gone away. Pt reports nothing is helping her. Pt reports having coughing spells that cause her to vomit.

## 2019-08-03 ENCOUNTER — Other Ambulatory Visit: Payer: Self-pay

## 2019-08-03 ENCOUNTER — Emergency Department (HOSPITAL_COMMUNITY)
Admission: EM | Admit: 2019-08-03 | Discharge: 2019-08-03 | Disposition: A | Discharge status: Home / Self Care | Payer: Self-pay | Attending: Emergency Medicine | Admitting: Emergency Medicine

## 2019-08-03 ENCOUNTER — Encounter (HOSPITAL_COMMUNITY): Payer: Self-pay | Admitting: Emergency Medicine

## 2019-08-03 DIAGNOSIS — R200 Anesthesia of skin: Secondary | ICD-10-CM | POA: Insufficient documentation

## 2019-08-03 DIAGNOSIS — Z5321 Procedure and treatment not carried out due to patient leaving prior to being seen by health care provider: Secondary | ICD-10-CM | POA: Insufficient documentation

## 2019-08-03 DIAGNOSIS — R1011 Right upper quadrant pain: Secondary | ICD-10-CM | POA: Insufficient documentation

## 2019-08-03 DIAGNOSIS — R110 Nausea: Secondary | ICD-10-CM | POA: Insufficient documentation

## 2019-08-03 HISTORY — DX: COVID-19: U07.1

## 2019-08-03 LAB — COMPREHENSIVE METABOLIC PANEL
ALT: 14 U/L (ref 0–44)
AST: 14 U/L — ABNORMAL LOW (ref 15–41)
Albumin: 3.9 g/dL (ref 3.5–5.0)
Alkaline Phosphatase: 43 U/L (ref 38–126)
Anion gap: 9 (ref 5–15)
BUN: 10 mg/dL (ref 6–20)
CO2: 27 mmol/L (ref 22–32)
Calcium: 9.4 mg/dL (ref 8.9–10.3)
Chloride: 103 mmol/L (ref 98–111)
Creatinine, Ser: 0.76 mg/dL (ref 0.44–1.00)
GFR calc Af Amer: >60 mL/min (ref >60)
GFR calc non Af Amer: >60 mL/min (ref >60)
Glucose, Bld: 99 mg/dL (ref 70–99)
Potassium: 4.8 mmol/L (ref 3.5–5.1)
Sodium: 139 mmol/L (ref 135–145)
Total Bilirubin: 0.6 mg/dL (ref 0.3–1.2)
Total Protein: 7.4 g/dL (ref 6.5–8.1)

## 2019-08-03 LAB — CBC
HCT: 39.6 % (ref 36.0–46.0)
Hemoglobin: 12.5 g/dL (ref 12.0–15.0)
MCH: 30 pg (ref 26.0–34.0)
MCHC: 31.6 g/dL (ref 30.0–36.0)
MCV: 95.2 fL (ref 80.0–100.0)
Platelets: 361 10*3/uL (ref 150–400)
RBC: 4.16 MIL/uL (ref 3.87–5.11)
RDW: 11.9 % (ref 11.5–15.5)
WBC: 4.1 10*3/uL (ref 4.0–10.5)
nRBC: 0 % (ref 0.0–0.2)

## 2019-08-03 LAB — URINALYSIS, ROUTINE W REFLEX MICROSCOPIC
Bilirubin Urine: NEGATIVE
Glucose, UA: NEGATIVE mg/dL
Hgb urine dipstick: NEGATIVE
Ketones, ur: NEGATIVE mg/dL
Leukocytes,Ua: NEGATIVE
Nitrite: NEGATIVE
Protein, ur: NEGATIVE mg/dL
Specific Gravity, Urine: 1.024 (ref 1.005–1.030)
pH: 5 (ref 5.0–8.0)

## 2019-08-03 LAB — I-STAT BETA HCG BLOOD, ED (MC, WL, AP ONLY): I-stat hCG, quantitative: <5 m[IU]/mL (ref <5)

## 2019-08-03 LAB — LIPASE, BLOOD: Lipase: 30 U/L (ref 11–51)

## 2019-08-03 MED ORDER — SODIUM CHLORIDE 0.9% FLUSH: 3.0000 mL | Freq: Once | INTRAVENOUS | Status: DC

## 2019-08-03 NOTE — ED Triage Notes (Signed)
C/o upper R lateral abd pain x 3 days with nausea.  Denies vomiting and diarrhea.  Diagnosed with COVID on 5/11.  Also reports R toe numbness x 2 days.

## 2019-08-03 NOTE — ED Notes (Signed)
Pt LWBS 

## 2021-04-27 ENCOUNTER — Encounter: Payer: BC Managed Care – PPO | Attending: Obstetrics and Gynecology | Admitting: Registered"

## 2022-05-25 ENCOUNTER — Other Ambulatory Visit: Payer: Self-pay

## 2022-05-25 ENCOUNTER — Encounter (HOSPITAL_COMMUNITY): Payer: Self-pay

## 2022-05-25 ENCOUNTER — Emergency Department (HOSPITAL_COMMUNITY)
Admission: EM | Admit: 2022-05-25 | Discharge: 2022-05-25 | Disposition: A | Discharge status: Home / Self Care | Payer: Medicaid Other | Attending: Emergency Medicine | Admitting: Emergency Medicine

## 2022-05-25 DIAGNOSIS — R059 Cough, unspecified: Secondary | ICD-10-CM | POA: Diagnosis present

## 2022-05-25 DIAGNOSIS — R0981 Nasal congestion: Secondary | ICD-10-CM

## 2022-05-25 DIAGNOSIS — R051 Acute cough: Secondary | ICD-10-CM

## 2022-05-25 DIAGNOSIS — R0789 Other chest pain: Secondary | ICD-10-CM | POA: Insufficient documentation

## 2022-05-25 HISTORY — DX: Polycystic ovarian syndrome: E28.2

## 2022-05-25 MED ORDER — ALBUTEROL SULFATE HFA 108 (90 BASE) MCG/ACT IN AERS: 1.0000 | INHALATION_SPRAY | Freq: Four times a day (QID) | RESPIRATORY_TRACT | 0 refills | Status: AC | PRN

## 2022-05-25 MED ORDER — HYDROCODONE BIT-HOMATROP MBR 5-1.5 MG/5ML PO SOLN: 5.0000 mL | Freq: Four times a day (QID) | ORAL | 0 refills | Status: AC | PRN

## 2022-05-25 NOTE — ED Provider Notes (Signed)
[facility] Provider Note   CSN: [id number] Arrival date & time: 05/25/22  [id number]     History  Chief Complaint  Patient presents with   Chest Pain   Cough   Back Pain    Sabrina Kerr is a 32 y.o. female.  32 year old female presents today for evaluation of persistent cough, chest discomfort, back pain.  She states 3 weeks ago she developed an upper respiratory infection.  Most of her symptoms have subsided however she has a persistent cough.  She states she has multiple violent coughing spells throughout the day.  Chest pain and back pain started a couple days ago.  Worse with palpation, coughing, or any exertion.  No prior personal or family history of cardiac disease.  Denies DVT or PE history.  Does have history of asthma.  She feels as if he outgrew this when she was 32 years old.  Has not had an albuterol inhaler on hand.  Denies any wheezing.  No shortness of breath with the exception of at the end of significant coughing spells.  The history is provided by the patient. No language interpreter was used.       Home Medications Prior to Admission medications   Medication Sig Start Date End Date Taking? Authorizing Provider  albuterol (VENTOLIN HFA) 108 (90 Base) MCG/ACT inhaler Inhale 1-2 puffs into the lungs every 6 (six) hours as needed for wheezing or shortness of breath. 05/25/22  Yes Yazmeen Woolf, PA-C  HYDROcodone bit-homatropine (HYCODAN) 5-1.5 MG/5ML syrup Take 5 mLs by mouth every 6 (six) hours as needed for cough. 05/25/22  Yes Deatra Canter, Kazzandra Desaulniers, PA-C  acetaminophen (TYLENOL) 500 MG tablet Take 1,000 mg by mouth every 4 (four) hours as needed for mild pain or headache.    [provider]  Aspirin-Acetaminophen-Caffeine (GOODY HEADACHE PO) Take 1 packet by mouth every 6 (six) hours as needed (pain).    [provider]  doxycycline (VIBRAMYCIN) 100 MG capsule Take 1 capsule (100 mg total) by mouth 2 (two) times daily.  12/16/16   Lawyer, Harrell Gave, PA-C  ondansetron (ZOFRAN ODT) 4 MG disintegrating tablet Take 1 tablet (4 mg total) by mouth every 8 (eight) hours as needed for nausea or vomiting. 03/13/18   Kinnie Feil, PA-C      Allergies    Patient has no known allergies.    Review of Systems   Review of Systems  Constitutional:  Negative for chills and fever.  Respiratory:  Positive for cough. Negative for shortness of breath.   Cardiovascular:  Positive for chest pain. Negative for palpitations and leg swelling.  Gastrointestinal:  Negative for abdominal pain.  Neurological:  Negative for syncope and light-headedness.  All other systems reviewed and are negative.   Physical Exam Updated Vital Signs BP 125/85 (BP Location: Right Arm)   Pulse 80   Temp 98.3 F (36.8 C) (Oral)   Resp 18   Ht 5\' 7"  (1.702 m)   Wt 110.7 kg   SpO2 98%   BMI 38.22 kg/m  Physical Exam Vitals and nursing note reviewed.  Constitutional:      General: She is not in acute distress.    Appearance: Normal appearance. She is not ill-appearing.  HENT:     Head: Normocephalic and atraumatic.     Nose: Nose normal.  Eyes:     General: No scleral icterus.    Extraocular Movements: Extraocular movements intact.     Conjunctiva/sclera: Conjunctivae normal.  Cardiovascular:  Rate and Rhythm: Normal rate and regular rhythm.     Pulses: Normal pulses.  Pulmonary:     Effort: Pulmonary effort is normal. No respiratory distress.     Breath sounds: Wheezing (Wheezing in the upper lung fields) present. No rales.  Abdominal:     General: There is no distension.     Tenderness: There is no abdominal tenderness.  Musculoskeletal:        General: Normal range of motion.     Cervical back: Normal range of motion.  Skin:    General: Skin is warm and dry.  Neurological:     General: No focal deficit present.     Mental Status: She is alert and oriented to person, place, and time. Mental status is at baseline.      ED Results / Procedures / Treatments   Labs (all labs ordered are listed, but only abnormal results are displayed) Labs Reviewed - No data to display  EKG EKG Interpretation  Date/Time:  Saturday May 25 2022 07:35:15 EDT Ventricular Rate:  73 PR Interval:  194 QRS Duration: 76 QT Interval:  394 QTC Calculation: 434 R Axis:   63 Text Interpretation: Normal sinus rhythm Normal ECG No old tracing to compare Confirmed by Deno Etienne [phone]) on 05/25/2022 7:59:18 AM  Radiology No results found.  Procedures Procedures    Medications Ordered in ED Medications - No data to display  ED Course/ Medical Decision Making/ A&P                             Medical Decision Making Risk Prescription drug management.   32 year old female presents today for evaluation of cough, chest pain, back pain.  Pain is worse with exertion.  Low suspicion for ACS.  EKG without acute ischemic changes.  No prior history of CAD.  No history of DVT or PE.  Low suspicion for PE.  Not hypoxic, without tachycardia or tachypnea.  We discussed additional workup to rule out ACS or myocarditis however she defers and would like symptomatic management and will return for any worsening symptoms.  I feel this is reasonable.  Will prescribe Hycodan.  Has history of asthma.  Faint wheezing noted.  Will prescribe albuterol to keep on hand.  She has PCP follow-up.  She will schedule follow-up.  Likely postinfectious cough.  Also reports some postnasal drip.  Discussed using sinus rinse.  Patient is in agreement with plan.  She is appropriate for discharge.  Final Clinical Impression(s) / ED Diagnoses Final diagnoses:  Acute cough  Sinus congestion    Rx / DC Orders ED Discharge Orders          Ordered    HYDROcodone bit-homatropine (HYCODAN) 5-1.5 MG/5ML syrup  Every 6 hours PRN        05/25/22 0816    albuterol (VENTOLIN HFA) 108 (90 Base) MCG/ACT inhaler  Every 6 hours PRN        05/25/22 Caldwell, Hayden, PA-C 05/25/22 0825    Deno Etienne, DO 05/25/22 [phone]

## 2022-05-25 NOTE — Discharge Instructions (Addendum)
Your exam today is overall reassuring.  EKG did not show any concerning findings.  We did discuss additional workup however your story fits with your pain being associated with these violent coughing spells you are having.  You did not want to pursue additional workup at this time and would return if you have any worsening symptoms.  I have sent in a cough medicine to your pharmacy.  Also recommend that you do a sinus rinse.  Given your history of asthma and faint wheezing I have prescribed you albuterol inhaler.  Use this as needed.  Follow-up with your PCP.

## 2022-05-25 NOTE — ED Triage Notes (Signed)
Pt reports chest pain for the past three days, along with back pain and a lingering cough since getting over a cold for the past three weeks.

## 2023-02-08 ENCOUNTER — Encounter (HOSPITAL_COMMUNITY): Payer: Self-pay | Admitting: Emergency Medicine

## 2023-02-08 ENCOUNTER — Emergency Department (HOSPITAL_COMMUNITY)
Admission: EM | Admit: 2023-02-08 | Discharge: 2023-02-08 | Disposition: A | Discharge status: Home / Self Care | Payer: No Typology Code available for payment source | Attending: Emergency Medicine | Admitting: Emergency Medicine

## 2023-02-08 ENCOUNTER — Other Ambulatory Visit: Payer: Self-pay

## 2023-02-08 ENCOUNTER — Emergency Department (HOSPITAL_COMMUNITY): Payer: No Typology Code available for payment source

## 2023-02-08 DIAGNOSIS — S20211A Contusion of right front wall of thorax, initial encounter: Secondary | ICD-10-CM | POA: Insufficient documentation

## 2023-02-08 DIAGNOSIS — Z87891 Personal history of nicotine dependence: Secondary | ICD-10-CM | POA: Diagnosis not present

## 2023-02-08 DIAGNOSIS — S299XXA Unspecified injury of thorax, initial encounter: Secondary | ICD-10-CM | POA: Diagnosis present

## 2023-02-08 DIAGNOSIS — Y92410 Unspecified street and highway as the place of occurrence of the external cause: Secondary | ICD-10-CM | POA: Insufficient documentation

## 2023-02-08 MED ORDER — IBUPROFEN 600 MG PO TABS: 600.0000 mg | ORAL_TABLET | Freq: Four times a day (QID) | ORAL | 0 refills | Status: AC | PRN

## 2023-02-08 MED ORDER — IBUPROFEN 800 MG PO TABS
800.0000 mg | ORAL_TABLET | Freq: Once | ORAL | Status: AC
  Administered 2023-02-08: 800 mg via ORAL
  Filled 2023-02-08: qty 1

## 2023-02-08 MED ORDER — LIDOCAINE 5 % EX PTCH
1.0000 | MEDICATED_PATCH | CUTANEOUS | Status: DC
  Administered 2023-02-08: 1 via TRANSDERMAL
  Filled 2023-02-08: qty 1
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
## 2023-02-08 NOTE — Discharge Instructions (Signed)
Motrin as needed as prescribed for pain. Take with food. Warm compresses to sore muscles for 20 minutes at a time. Recheck with your primary care provider if not improving in 3-5 days.
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
## 2023-02-08 NOTE — ED Notes (Signed)
Patient transported to X-ray
